# Patient Record
Sex: Male | Born: 1965 | Race: Black or African American | Hispanic: No | Marital: Married | Smoking: Never smoker
Health system: Southern US, Community
[De-identification: ages and names within clinical notes are randomized; demographics above are authoritative.]

## PROBLEM LIST (undated history)

## (undated) DIAGNOSIS — I10 Essential (primary) hypertension: Secondary | ICD-10-CM

## (undated) HISTORY — PX: BACK SURGERY: SHX140

---

## 1999-08-08 ENCOUNTER — Encounter (INDEPENDENT_AMBULATORY_CARE_PROVIDER_SITE_OTHER): Payer: Self-pay | Admitting: Specialist

## 1999-08-08 ENCOUNTER — Encounter: Payer: Self-pay | Admitting: Emergency Medicine

## 1999-08-09 ENCOUNTER — Inpatient Hospital Stay (HOSPITAL_COMMUNITY): Admission: EM | Admit: 1999-08-09 | Discharge: 1999-08-10 | Payer: Self-pay | Admitting: Emergency Medicine

## 2001-01-16 ENCOUNTER — Emergency Department (HOSPITAL_COMMUNITY): Admission: EM | Admit: 2001-01-16 | Discharge: 2001-01-17 | Payer: Self-pay | Admitting: Emergency Medicine

## 2008-04-15 ENCOUNTER — Emergency Department (HOSPITAL_COMMUNITY): Admission: EM | Admit: 2008-04-15 | Discharge: 2008-04-15 | Payer: Self-pay | Admitting: Emergency Medicine

## 2008-04-26 ENCOUNTER — Ambulatory Visit (HOSPITAL_COMMUNITY): Admission: RE | Admit: 2008-04-26 | Discharge: 2008-04-26 | Payer: Self-pay | Admitting: Neurosurgery

## 2008-04-29 ENCOUNTER — Ambulatory Visit (HOSPITAL_COMMUNITY): Admission: RE | Admit: 2008-04-29 | Discharge: 2008-04-30 | Payer: Self-pay | Admitting: Neurosurgery

## 2010-05-17 ENCOUNTER — Emergency Department (HOSPITAL_COMMUNITY): Admission: EM | Admit: 2010-05-17 | Discharge: 2010-05-17 | Payer: Self-pay | Admitting: Emergency Medicine

## 2011-05-14 NOTE — Op Note (Signed)
NAMERITA, PROM                ACCOUNT NO.:  [id number]   MEDICAL RECORD NO.:  [id number]          PATIENT TYPE:  OIB   LOCATION:  3537                         FACILITY:  [facility]   PHYSICIAN:  Danae Orleans. Venetia Maxon, M.D.  DATE OF BIRTH:  1966/09/20   DATE OF PROCEDURE:  04/29/2008  DATE OF DISCHARGE:                               OPERATIVE REPORT   PREOPERATIVE DIAGNOSES:  Herniated cervical disk with cervical  spondylosis, stenosis, and cervical myelopathy at C4-5 and C5-6 levels.   POSTOPERATIVE DIAGNOSES:  Herniated cervical disk with cervical  spondylosis, stenosis, and cervical myelopathy at C4-5 and C5-6 levels.   PROCEDURE:  Anterior cervical decompression and fusion C4-5 and C5-6  with allograft bone graft, morselized bone autograft with local  autograft FortrOss and anterior cervical plate.   SURGEON:  Danae Orleans. Venetia Maxon, MD   ASSISTANT:  Hilda Lias, MD   ANESTHESIA:  General endotracheal anesthesia.   ESTIMATED BLOOD LOSS:  Minimal.   COMPLICATIONS:  None.   DISPOSITION:  Recovery.   INDICATIONS:  Alex Barry is a 45 year old man with acute C5  radiculopathy with cord compression and significant stenosis at C4-5 and  C5-6 levels.  Preoperative MRI demonstrates a foraminal disk herniation  at C4-5 with right C5 nerve root compression as well as canal stenosis  and cord signal at the level of C5.  Cervical canal diameter is 8 mm at  C4-5 and C5-6 levels.  We elected to take him surgery on an urgent basis  for anterior cervical decompression and fusion at the C4-5 and C5-6  levels.   PROCEDURE:  Alex Barry was brought to the operating room.  Following  satisfactory and uncomplicated induction of general endotracheal  anesthesia plus intravenous lines, the patient was placed in the supine  position on the operating table.  His neck was maintained in neutral  alignment.  He was placed in 5 pounds halter traction.  His anterior  neck was then prepped and draped in the  usual sterile fashion.  The area  of planned incision was infiltrated with 0.25% Marcaine and 0.5%  lidocaine with 1:100,000 epinephrine.  Incision was made from midline to  the anterior border of sternocleidomastoid muscle and carried sharply  through platysma layer.  The subplatysmal dissection was performed  exposing the anterior border of sternocleidomastoid muscle using blunt  dissection.  The carotid sheath was kept lateral and trachea and  esophagus kept medial exposing the anterior cervical spine.  A bent  spinal needle was placed was felt to be at the C4-5 level and this was  confirmed on intraoperative x-ray.  Subsequently, the longus colli  muscles were taken down from C4-C6 using electrocautery, Key elevator,  and self-retaining shadow line retractor was placed to facilitate the  exposure along with up and down retractors.  The interspaces at C4-5 and  C5-6 were then incised with a 15 blade and disk material was removed in  the piecemeal fashion.  Caspar distraction pins were placed at C4 and  C5.  Using gentle distraction, the interspace was opened.  Endplates  were stripped of residual disk  material, and using high-speed drill, the  endplates were slightly decorticated and bone drilled at this time was  saved for later use in bone grafting.  The uncinate spurs were drilled  down bilaterally using a smaller high-speed drill bit.  The posterior  longitudinal ligament was then incised and removed in piecemeal fashion,  and decompression was carried out particularly overlying the right C5  nerve root.  A large amount of herniated disk material including a  fragment that was quite lateral in the foramen were removed with  resultant significant decompression of the C5 nerve root.  Hemostasis  was assured with Gelfoam soaked in thrombin.  Spinal cord dura was  decompressed as well as the left neural foramen.  After trial sizing a 7-  mm bone allograft wedge which was fashioned with  high-speed drill,  packed with morselized bone autograft which was mixed with FortrOss and  autogenous blood, inserted in the interspace and countersunk  appropriately.  Attention was then turned to the C5-6 level and  distraction pin was removed from C4 placed at C6, and using gentle  distraction, this interspace was also opened.  The endplates were again  decorticated and uncinate spurs drilled down.  The posterior  longitudinal ligament was incised and removed in piecemeal fashion using  2-3 mm gold-tip Kerrison rongeurs and both of these C6 nerve roots were  decompressed as they extended out the neural foramina as was the central  spinal cord dura.  Hemostasis again assured and after trial sizing a 6-  mm allograft bone wedge was selected, fashioned with high-speed drill,  packed with morselized bone autograft FortrOss and autogenous blood,  inserted in the interspace and countersunk appropriately.  A 32-mm of  Trestle anterior cervical plate was then affixed to the anterior  cervical spine using variable angle of 14-mm screws, two at C5 and two  at C6.  All screws had excellent purchase.  Locking mechanisms were  engaged.  Final x-ray demonstrated well-positioned interbody grafts and  anterior cervical plate.  The superior aspect of the construct was  visualized, although the inferior aspect was not visualized because of  the patient's large body habitus.  The traction weight was removed prior  to placing the plate.  Hemostasis was assured.  Soft tissues were  inspected and found to be in good repair.  Platysma layer was closed  with 3-0 Vicryl sutures.  Skin edges were approximated with 3-0 Vicryl  subcuticular stitch.  The wound was dressed with Dermabond.  The patient  was extubated in the operating room and taken to recovery room in stable  satisfactory condition having tolerated this operation well.  Counts  were correct at the end of the case.      Danae Orleans. Venetia Maxon, M.D.   Electronically Signed     JDS/MEDQ  D:  04/29/2008  T:  04/30/2008  Job:  [id number]

## 2012-08-19 ENCOUNTER — Emergency Department (HOSPITAL_COMMUNITY)
Admission: EM | Admit: 2012-08-19 | Discharge: 2012-08-19 | Disposition: A | Discharge status: Home / Self Care | Payer: Self-pay | Attending: Emergency Medicine | Admitting: Emergency Medicine

## 2012-08-19 ENCOUNTER — Encounter (HOSPITAL_COMMUNITY): Payer: Self-pay | Admitting: *Deleted

## 2012-08-19 DIAGNOSIS — T148XXA Other injury of unspecified body region, initial encounter: Secondary | ICD-10-CM

## 2012-08-19 DIAGNOSIS — R252 Cramp and spasm: Secondary | ICD-10-CM

## 2012-08-19 DIAGNOSIS — M79609 Pain in unspecified limb: Secondary | ICD-10-CM | POA: Insufficient documentation

## 2012-08-19 DIAGNOSIS — M7989 Other specified soft tissue disorders: Secondary | ICD-10-CM | POA: Insufficient documentation

## 2012-08-19 MED ORDER — DIAZEPAM 5 MG PO TABS
5.0000 mg | ORAL_TABLET | Freq: Once | ORAL | Status: AC
  Administered 2012-08-19: 5 mg via ORAL
  Filled 2012-08-19: qty 1

## 2012-08-19 MED ORDER — IBUPROFEN 200 MG PO TABS
600.0000 mg | ORAL_TABLET | Freq: Once | ORAL | Status: AC
  Administered 2012-08-19: 600 mg via ORAL
  Filled 2012-08-19: qty 3

## 2012-08-19 MED ORDER — CYCLOBENZAPRINE HCL 10 MG PO TABS: 10.0000 mg | ORAL_TABLET | Freq: Two times a day (BID) | ORAL | Status: AC | PRN

## 2012-08-19 MED ORDER — HYDROCODONE-ACETAMINOPHEN 5-325 MG PO TABS: 1.0000 | ORAL_TABLET | Freq: Four times a day (QID) | ORAL | Status: AC | PRN

## 2012-08-19 MED ORDER — IBUPROFEN 600 MG PO TABS: 600.0000 mg | ORAL_TABLET | Freq: Four times a day (QID) | ORAL | Status: AC | PRN

## 2012-08-19 MED ORDER — HYDROCODONE-ACETAMINOPHEN 5-325 MG PO TABS
2.0000 | ORAL_TABLET | Freq: Once | ORAL | Status: AC
  Administered 2012-08-19: 2 via ORAL
  Filled 2012-08-19: qty 2

## 2012-08-19 NOTE — ED Notes (Signed)
Pt c/o upper left thigh pain since Saturday.  States he is worried he pulled a muscle, heavy lifting at work.

## 2012-08-19 NOTE — ED Provider Notes (Signed)
History     CSN: [id number]  Arrival date & time 08/19/12  6578   First MD Initiated Contact with Patient 08/19/12 [phone]      Chief Complaint  Patient presents with  . Leg Pain    (Consider location/radiation/quality/duration/timing/severity/associated sxs/prior treatment) HPI Comments: Pt with no medical hx comes in w/ cc of right leg pain. Pt states that his pain started on Saturday, but got worse last night, and he was unable to sleep thus he came to the ED. The pain is located in the lateral aspect of the thigh, and is reproducible with him walking, palpation and putting weight on it. No trauma that patient can remember - but he did have some heavy work over the weekend, and did have some "bumps" along the way. Pt has no hx of DVT, and there is no risk factor for the same.. No leg swelling, no redness/rash. No n/v/f/c.  Patient is a 46 y.o. male presenting with leg pain. The history is provided by the patient.  Leg Pain     History reviewed. No pertinent past medical history.  Past Surgical History  Procedure Date  . Back surgery     History reviewed. No pertinent family history.  History  Substance Use Topics  . Smoking status: Never Smoker   . Smokeless tobacco: Not on file  . Alcohol Use: Yes      Review of Systems  Constitutional: Negative for activity change and appetite change.  Respiratory: Negative for cough and shortness of breath.   Cardiovascular: Negative for chest pain.  Gastrointestinal: Negative for abdominal pain.  Genitourinary: Negative for dysuria.  Musculoskeletal: Positive for myalgias. Negative for arthralgias.  Skin: Negative for rash.    Allergies  Review of patient's allergies indicates no known allergies.  Home Medications  No current outpatient prescriptions on file.  BP 178/91  Pulse 97  Temp 98.3 F (36.8 C) (Oral)  Resp 18  SpO2 95%  Physical Exam  Nursing note and vitals reviewed. Constitutional: He is oriented to  person, place, and time. He appears well-developed.  HENT:  Head: Normocephalic and atraumatic.  Eyes: Conjunctivae and EOM are normal. Pupils are equal, round, and reactive to light.  Neck: Normal range of motion. Neck supple.  Cardiovascular: Normal rate and regular rhythm.   Pulmonary/Chest: Effort normal and breath sounds normal.  Abdominal: Soft. Bowel sounds are normal. He exhibits no distension. There is no tenderness. There is no rebound and no guarding.  Musculoskeletal: He exhibits no edema.       Left thigh laterally -there is tenderness to palpation just inferior to the gluteus and going down to the mid femur. There is no rubor, calor, dolor. Mild swelling only. The calf appears normal, with no tenderness, or swelling  Neurological: He is alert and oriented to person, place, and time.  Skin: Skin is warm.    ED Course  Procedures (including critical care time)  Labs Reviewed - No data to display No results found.   No diagnosis found.    MDM  DDx: DVT Cellulitis Osteomyelitis Superficial thrombophlebitis Osteomyelitis Trauma Contusion  A/P: Pt comes in with unilateral leg pain. There is no clinical evidence of DVT, infection. Pt has no risk factors for DVT, and my pretest probability for DVT is so low, that i frankly don't even think there is need for dimer or rule out DVT. Again - the pain is worse with palpation, is localized lateral thigh and there is no clinical evidence of DVT.  Symptoms and exam more consistent with spaskm or contusion.  Derwood Kaplan, MD 08/19/12 [phone]

## 2012-08-19 NOTE — ED Notes (Signed)
Pt ambulated to discharge independently

## 2016-12-26 ENCOUNTER — Emergency Department (HOSPITAL_COMMUNITY): Payer: Managed Care, Other (non HMO)

## 2016-12-26 ENCOUNTER — Emergency Department (HOSPITAL_COMMUNITY)
Admission: EM | Admit: 2016-12-26 | Discharge: 2016-12-26 | Disposition: A | Discharge status: Home / Self Care | Payer: Managed Care, Other (non HMO) | Attending: Emergency Medicine | Admitting: Emergency Medicine

## 2016-12-26 ENCOUNTER — Encounter (HOSPITAL_COMMUNITY): Payer: Self-pay

## 2016-12-26 DIAGNOSIS — Z79899 Other long term (current) drug therapy: Secondary | ICD-10-CM | POA: Insufficient documentation

## 2016-12-26 DIAGNOSIS — M6281 Muscle weakness (generalized): Secondary | ICD-10-CM | POA: Insufficient documentation

## 2016-12-26 DIAGNOSIS — Z5181 Encounter for therapeutic drug level monitoring: Secondary | ICD-10-CM | POA: Insufficient documentation

## 2016-12-26 DIAGNOSIS — R200 Anesthesia of skin: Secondary | ICD-10-CM | POA: Insufficient documentation

## 2016-12-26 DIAGNOSIS — I10 Essential (primary) hypertension: Secondary | ICD-10-CM | POA: Diagnosis not present

## 2016-12-26 DIAGNOSIS — R42 Dizziness and giddiness: Secondary | ICD-10-CM | POA: Diagnosis present

## 2016-12-26 HISTORY — DX: Essential (primary) hypertension: I10

## 2016-12-26 LAB — COMPREHENSIVE METABOLIC PANEL
ALT: 46 U/L (ref 17–63)
AST: 51 U/L — ABNORMAL HIGH (ref 15–41)
Albumin: 4.5 g/dL (ref 3.5–5.0)
Alkaline Phosphatase: 54 U/L (ref 38–126)
Anion gap: 13 (ref 5–15)
BUN: 18 mg/dL (ref 6–20)
CO2: 28 mmol/L (ref 22–32)
Calcium: 10.4 mg/dL — ABNORMAL HIGH (ref 8.9–10.3)
Chloride: 96 mmol/L — ABNORMAL LOW (ref 101–111)
Creatinine, Ser: 1.38 mg/dL — ABNORMAL HIGH (ref 0.61–1.24)
GFR calc Af Amer: >60 mL/min (ref >60)
GFR calc non Af Amer: 59 mL/min — ABNORMAL LOW (ref >60)
Glucose, Bld: 146 mg/dL — ABNORMAL HIGH (ref 65–99)
Potassium: 4.3 mmol/L (ref 3.5–5.1)
Sodium: 137 mmol/L (ref 135–145)
Total Bilirubin: 1.7 mg/dL — ABNORMAL HIGH (ref 0.3–1.2)
Total Protein: 8.3 g/dL — ABNORMAL HIGH (ref 6.5–8.1)

## 2016-12-26 LAB — DIFFERENTIAL
Basophils Absolute: 0 10*3/uL (ref 0.0–0.1)
Basophils Relative: 0 %
Eosinophils Absolute: 0.1 10*3/uL (ref 0.0–0.7)
Eosinophils Relative: 1 %
Lymphocytes Relative: 22 %
Lymphs Abs: 2.2 10*3/uL (ref 0.7–4.0)
Monocytes Absolute: 0.6 10*3/uL (ref 0.1–1.0)
Monocytes Relative: 6 %
Neutro Abs: 7 10*3/uL (ref 1.7–7.7)
Neutrophils Relative %: 71 %

## 2016-12-26 LAB — APTT: aPTT: 34 seconds (ref 24–36)

## 2016-12-26 LAB — I-STAT CHEM 8, ED
BUN: 22 mg/dL — ABNORMAL HIGH (ref 6–20)
Calcium, Ion: 1.22 mmol/L (ref 1.15–1.40)
Chloride: 97 mmol/L — ABNORMAL LOW (ref 101–111)
Creatinine, Ser: 1.3 mg/dL — ABNORMAL HIGH (ref 0.61–1.24)
Glucose, Bld: 145 mg/dL — ABNORMAL HIGH (ref 65–99)
HCT: 54 % — ABNORMAL HIGH (ref 39.0–52.0)
Hemoglobin: 18.4 g/dL — ABNORMAL HIGH (ref 13.0–17.0)
Potassium: 4.2 mmol/L (ref 3.5–5.1)
Sodium: 139 mmol/L (ref 135–145)
TCO2: 30 mmol/L (ref 0–100)

## 2016-12-26 LAB — CBC
HCT: 51.8 % (ref 39.0–52.0)
Hemoglobin: 17.2 g/dL — ABNORMAL HIGH (ref 13.0–17.0)
MCH: 31.6 pg (ref 26.0–34.0)
MCHC: 33.2 g/dL (ref 30.0–36.0)
MCV: 95 fL (ref 78.0–100.0)
Platelets: 280 10*3/uL (ref 150–400)
RBC: 5.45 MIL/uL (ref 4.22–5.81)
RDW: 13.7 % (ref 11.5–15.5)
WBC: 9.9 10*3/uL (ref 4.0–10.5)

## 2016-12-26 LAB — I-STAT TROPONIN, ED: Troponin i, poc: 0.01 ng/mL (ref 0.00–0.08)

## 2016-12-26 LAB — PROTIME-INR
INR: 0.95
Prothrombin Time: 12.7 seconds (ref 11.4–15.2)

## 2016-12-26 MED ORDER — LABETALOL HCL 5 MG/ML IV SOLN
20.0000 mg | Freq: Once | INTRAVENOUS | Status: AC
  Administered 2016-12-26: 20 mg via INTRAVENOUS
  Filled 2016-12-26: qty 4

## 2016-12-26 MED ORDER — LORAZEPAM 2 MG/ML IJ SOLN
1.0000 mg | INTRAMUSCULAR | Status: DC | PRN
  Administered 2016-12-26: 1 mg via INTRAVENOUS
  Filled 2016-12-26: qty 1

## 2016-12-26 MED ORDER — LABETALOL HCL 100 MG PO TABS: 100.0000 mg | ORAL_TABLET | Freq: Two times a day (BID) | ORAL | 0 refills | Status: AC

## 2016-12-26 NOTE — Discharge Planning (Signed)
Pt up for discharge. [facility] reviewed chart for possible CM needs. [facility] met with pt at bedside regarding PCP establishment and encouraged pt to call number on card for PCP accepting new pts for his insurance.  Pt agrees to plan.  No needs further identified or communicated.

## 2016-12-26 NOTE — Discharge Instructions (Signed)
Exercise 20 minutes per day.  Eliminate salt.  Lofat diet. Weight control.  RX for labetalol for 30 days.

## 2016-12-26 NOTE — ED Provider Notes (Signed)
[facility]-EMERGENCY DEPT Provider Note   CSN: [id number] Arrival date & time: 12/26/16  1191     History   Chief Complaint Chief Complaint  Patient presents with  . Hypertension  . Dizziness    HPI Alex Barry is a 50 y.o. male.  He presents with dissiness and Blood Pressure concerns.    HPI:  He reports a history of hypertension. Was last on meds unknown time ago but he thinks it may has been as long as one year. This for the last several weeks she's noticed that when he stands he feels lightheaded for a few seconds. This is not a persistent dizziness. Denies vertigo. No chest pain. No leg edema no difficulty breathing. No PND orthopnea. States intermittently less hand will feel numb and it felt numb this morning when he awakened but has resolved now.  Past Medical History:  Diagnosis Date  . Hypertension     There are no active problems to display for this patient.   Past Surgical History:  Procedure Laterality Date  . BACK SURGERY         Home Medications    Prior to Admission medications   Medication Sig Start Date End Date Taking? Authorizing Provider  labetalol (NORMODYNE) 100 MG tablet Take 1 tablet (100 mg total) by mouth 2 (two) times daily. 12/26/16   Rolland Porter, MD    Family History History reviewed. No pertinent family history.  Social History Social History  Substance Use Topics  . Smoking status: Never Smoker  . Smokeless tobacco: Never Used  . Alcohol use Yes     Allergies   Patient has no known allergies.   Review of Systems Review of Systems  Constitutional: Negative for appetite change, chills, diaphoresis, fatigue and fever.  HENT: Negative for mouth sores, sore throat and trouble swallowing.   Eyes: Negative for visual disturbance.  Respiratory: Negative for cough, chest tightness, shortness of breath and wheezing.   Cardiovascular: Negative for chest pain.  Gastrointestinal: Negative for abdominal distention, abdominal pain,  diarrhea, nausea and vomiting.  Endocrine: Negative for polydipsia, polyphagia and polyuria.  Genitourinary: Negative for dysuria, frequency and hematuria.  Musculoskeletal: Negative for gait problem.  Skin: Negative for color change, pallor and rash.  Neurological: Positive for dizziness. Negative for syncope, light-headedness and headaches.  Hematological: Does not bruise/bleed easily.  Psychiatric/Behavioral: Negative for behavioral problems and confusion.     Physical Exam Updated Vital Signs BP (!) 170/108   Pulse 94   Temp 97.7 F (36.5 C) (Oral)   Resp 16   Ht 5\' 9"  (1.753 m)   Wt 210 lb (95.3 kg)   SpO2 92%   BMI 31.01 kg/m   Physical Exam  Constitutional: He is oriented to person, place, and time. He appears well-developed and well-nourished. No distress.  HENT:  Head: Normocephalic.  Eyes: Conjunctivae are normal. Pupils are equal, round, and reactive to light. No scleral icterus.  Neck: Normal range of motion. Neck supple. No thyromegaly present.  Cardiovascular: Normal rate and regular rhythm.  Exam reveals no gallop and no friction rub.   No murmur heard. Pulmonary/Chest: Effort normal and breath sounds normal. No respiratory distress. He has no wheezes. He has no rales.  Abdominal: Soft. Bowel sounds are normal. He exhibits no distension. There is no tenderness. There is no rebound.  Musculoskeletal: Normal range of motion.  Neurological: He is alert and oriented to person, place, and time.  Intact symmetric cranial nerves. No pronator drift. Reports "tingling" on  his left hand but does report normal sensation. normal sensation right upper cherry. normal strength sensation to bilateral reduction raise. normal gait.  Skin: Skin is warm and dry. No rash noted.  Psychiatric: He has a normal mood and affect. His behavior is normal.     ED Treatments / Results  Labs (all labs ordered are listed, but only abnormal results are displayed) Labs Reviewed  CBC -  Abnormal; Notable for the following:       Result Value   Hemoglobin 17.2 (*)    All other components within normal limits  COMPREHENSIVE METABOLIC PANEL - Abnormal; Notable for the following:    Chloride 96 (*)    Glucose, Bld 146 (*)    Creatinine, Ser 1.38 (*)    Calcium 10.4 (*)    Total Protein 8.3 (*)    AST 51 (*)    Total Bilirubin 1.7 (*)    GFR calc non Af Amer 59 (*)    All other components within normal limits  I-STAT CHEM 8, ED - Abnormal; Notable for the following:    Chloride 97 (*)    BUN 22 (*)    Creatinine, Ser 1.30 (*)    Glucose, Bld 145 (*)    Hemoglobin 18.4 (*)    HCT 54.0 (*)    All other components within normal limits  PROTIME-INR  APTT  DIFFERENTIAL  I-STAT TROPOININ, ED    EKG  EKG Interpretation  Date/Time:  Thursday December 26 2016 07:39:37 EST Ventricular Rate:  116 PR Interval:  190 QRS Duration: 66 QT Interval:  302 QTC Calculation: 419 R Axis:   67 Text Interpretation:  Sinus tachycardia Right atrial enlargement Anteroseptal infarct , age undetermined Abnormal ECG Confirmed by [name], [name] ([id number]) on 12/26/2016 8:36:41 AM       Radiology Ct Head Wo Contrast  Result Date: 12/26/2016 CLINICAL DATA:  Left upper extremity weakness and left hand tingling. Hypertension EXAM: CT HEAD WITHOUT CONTRAST TECHNIQUE: Contiguous axial images were obtained from the base of the skull through the vertex without intravenous contrast. COMPARISON:  None. FINDINGS: Brain: The ventricles are normal in size and configuration. There is no intracranial mass, hemorrhage, extra-axial fluid collection, or midline shift. Gray-white compartments appear normal. No acute infarct evident. Vascular: There is no hyperdense vessel. There are foci of calcification in each distal carotid siphon region. Skull: The bony calvarium appears intact. Sinuses/Orbits: There is mucosal thickening in several ethmoid air cells bilaterally. Other visualized paranasal sinuses are  clear. Orbits appear symmetric bilaterally. Other: The visualized mastoid air cells are clear. IMPRESSION: No intracranial mass, hemorrhage, or extra-axial fluid collection. Gray-white compartments are normal. There is mild distal carotid artery calcification bilaterally. There is ethmoid sinus disease bilaterally. Electronically Signed   By: Bretta BangWilliam  Woodruff III M.D.   On: 12/26/2016 08:14   Mr Brain Wo Contrast  Result Date: 12/26/2016 CLINICAL DATA:  Dizziness and left hand numbness. Acute onset. Negative CT. EXAM: MRI HEAD WITHOUT CONTRAST TECHNIQUE: Multiplanar, multiecho pulse sequences of the brain and surrounding structures were obtained without intravenous contrast. COMPARISON:  Head CT same day. FINDINGS: Brain: The brain has normal appearance without evidence of malformation, atrophy, old or acute small or large vessel infarction, hemorrhage, hydrocephalus or extra-axial collection. No pituitary abnormality. Vascular: Major vessels at the base of the brain show flow. Skull and upper cervical spine: Normal Sinuses/Orbits: Clear/ normal. Other: None significant. IMPRESSION: Normal examination. Electronically Signed   By: Paulina FusiMark  Shogry M.D.   On:  12/26/2016 10:37    Procedures Procedures (including critical care time)  Medications Ordered in ED Medications  LORazepam (ATIVAN) injection 1 mg (1 mg Intravenous Given 12/26/16 0929)  labetalol (NORMODYNE,TRANDATE) injection 20 mg (20 mg Intravenous Given 12/26/16 0900)     Initial Impression / Assessment and Plan / ED Course  I have reviewed the triage vital signs and the nursing notes.  Pertinent labs & imaging results that were available during my care of the patient were reviewed by me and considered in my medical decision making (see chart for details).  Clinical Course     She labetalol one dose IV. Recheck 152/70. CT, and MRI shows no acute abnormalities. He feels well no symptoms currently. Discharge home. Her sugars of her  labetalol 100 twice a day. Primary care follow-up. Encourage diet, salt restriction, weight control, exercise. Follow-up, blood pressure medicine complaints.  Final Clinical Impressions(s) / ED Diagnoses   Final diagnoses:  Hypertension, unspecified type    New Prescriptions New Prescriptions   LABETALOL (NORMODYNE) 100 MG TABLET    Take 1 tablet (100 mg total) by mouth 2 (two) times daily.     Rolland PorterMark Maleek Craver, MD 12/26/16 1153

## 2016-12-26 NOTE — ED Triage Notes (Signed)
Pt reports he believes his BP is high. He has dizziness and left arm numbness that began yesterday. Pt reports hx of htn and has not been taking medications.

## 2016-12-26 NOTE — ED Notes (Signed)
Pt transported to MRI 

## 2016-12-26 NOTE — ED Notes (Signed)
Pt given discharge paperwork and prescriptions. Pt verbalized understanding.  

## 2020-10-22 ENCOUNTER — Ambulatory Visit (HOSPITAL_COMMUNITY)
Admission: EM | Admit: 2020-10-22 | Discharge: 2020-10-22 | Disposition: A | Discharge status: Home / Self Care | Payer: Worker's Compensation | Attending: Family Medicine | Admitting: Family Medicine

## 2020-10-22 ENCOUNTER — Other Ambulatory Visit: Payer: Self-pay

## 2020-10-22 ENCOUNTER — Encounter (HOSPITAL_COMMUNITY): Payer: Self-pay | Admitting: *Deleted

## 2020-10-22 DIAGNOSIS — R030 Elevated blood-pressure reading, without diagnosis of hypertension: Secondary | ICD-10-CM

## 2020-10-22 DIAGNOSIS — M545 Low back pain, unspecified: Secondary | ICD-10-CM | POA: Diagnosis not present

## 2020-10-22 MED ORDER — METHYLPREDNISOLONE 4 MG PO TBPK: ORAL_TABLET | ORAL | 0 refills | Status: AC

## 2020-10-22 MED ORDER — HYDROCODONE-ACETAMINOPHEN 7.5-325 MG PO TABS: 1.0000 | ORAL_TABLET | Freq: Four times a day (QID) | ORAL | 0 refills | Status: AC | PRN

## 2020-10-22 NOTE — ED Triage Notes (Signed)
Pt reports he pick up a tank at work and heard his back pop . Pt reports pain does not radiate to legs. Pt arrived to UC with back brace from home. Pt ambulatory to room with out assistance.

## 2020-10-22 NOTE — ED Provider Notes (Signed)
[facility]    CSN: [id number] Arrival date & time: 10/22/20  1125      History   Chief Complaint Chief Complaint  Patient presents with  . Back Pain    HPI Alex Barry is a 54 y.o. male.   HPI  Patient states that he has to manage heavy things at work.  He states that he has to lift and push and pull on these during the day.  He occasionally has low back pain.  He wears a back brace when he remembers.  He has never had a significant back injury or known back condition.  He states that he was moving everything when he felt a pop and pain in his back.  Is in the central low back.  No radiation.  No numbness or weakness.  No bowel or bladder complaint  Past Medical History:  Diagnosis Date  . Hypertension     There are no problems to display for this patient.   Past Surgical History:  Procedure Laterality Date  . BACK SURGERY         Home Medications    Prior to Admission medications   Medication Sig Start Date End Date Taking? Authorizing Provider  HYDROcodone-acetaminophen (NORCO) 7.5-325 MG tablet Take 1 tablet by mouth every 6 (six) hours as needed for moderate pain. 10/22/20   Eustace Moore, MD  labetalol (NORMODYNE) 100 MG tablet Take 1 tablet (100 mg total) by mouth 2 (two) times daily. 12/26/16   Rolland Porter, MD  methylPREDNISolone (MEDROL DOSEPAK) 4 MG TBPK tablet TAD 10/22/20   Eustace Moore, MD    Family History History reviewed. No pertinent family history.  Social History Social History   Tobacco Use  . Smoking status: Never Smoker  . Smokeless tobacco: Never Used  Substance Use Topics  . Alcohol use: Yes  . Drug use: No     Allergies   Patient has no known allergies.   Review of Systems Review of Systems  See HPI Physical Exam Triage Vital Signs ED Triage Vitals  Enc Vitals Group     BP 10/22/20 1222 (!) 189/92     Pulse Rate 10/22/20 1222 95     Resp 10/22/20 1222 18     Temp 10/22/20 1222 98.8 F (37.1  C)     Temp Source 10/22/20 1222 Oral     SpO2 10/22/20 1222 96 %     Weight 10/22/20 1223 196 lb (88.9 kg)     Height 10/22/20 1223 5\' 9"  (1.753 m)     Head Circumference --      Peak Flow --      Pain Score 10/22/20 1223 10     Pain Loc --      Pain Edu? --      Excl. in GC? --    No data found.  Updated Vital Signs BP (!) 189/92 (BP Location: Right Arm)   Pulse 95   Temp 98.8 F (37.1 C) (Oral)   Resp 18   Ht 5\' 9"  (1.753 m)   Wt 88.9 kg   SpO2 96%   BMI 28.94 kg/m     Physical Exam Constitutional:      General: He is not in acute distress.    Appearance: He is well-developed and normal weight.  HENT:     Head: Normocephalic and atraumatic.     Mouth/Throat:     Comments: Mask is in place Eyes:     Conjunctiva/sclera: Conjunctivae  normal.     Pupils: Pupils are equal, round, and reactive to light.  Cardiovascular:     Rate and Rhythm: Normal rate.  Pulmonary:     Effort: Pulmonary effort is normal. No respiratory distress.  Abdominal:     General: There is no distension.     Palpations: Abdomen is soft.  Musculoskeletal:        General: Normal range of motion.     Cervical back: Normal range of motion.     Comments: Back is straight and symmetric.  There is no tenderness to palpation.  No palpable muscle spasm.  Patient can flex fingertips to just below the knees and, arise slowly.  Normal lateral movement.  Strength sensation range of motion and reflexes are normal in both lower extremities  Skin:    General: Skin is warm and dry.  Neurological:     General: No focal deficit present.     Mental Status: He is alert.     Motor: No weakness.     Coordination: Coordination normal.     Gait: Gait normal.     Deep Tendon Reflexes: Reflexes normal.  Psychiatric:        Mood and Affect: Mood normal.        Behavior: Behavior normal.      UC Treatments / Results  Labs (all labs ordered are listed, but only abnormal results are displayed) Labs Reviewed -  No data to display  EKG   Radiology No results found.  Procedures Procedures (including critical care time)  Medications Ordered in UC Medications - No data to display  Initial Impression / Assessment and Plan / UC Course  I have reviewed the triage vital signs and the nursing notes.  Pertinent labs & imaging results that were available during my care of the patient were reviewed by me and considered in my medical decision making (see chart for details).     Mechanical back pain.  Discussed. Recommend follow-up for blood pressure Final Clinical Impressions(s) / UC Diagnoses   Final diagnoses:  Acute midline low back pain without sciatica  Elevated blood pressure reading     Discharge Instructions     Take the Medrol Dosepak as directed.  This is a steroid anti-inflammatory.  Take all of day 1 today. Take pain medication as needed.  Do not drive on pain medication Follow-up with Worker's Comp. Tomorrow Avoid bending , lifting for a few days  You need to follow-up on your elevated blood pressure.   ED Prescriptions    Medication Sig Dispense Auth. Provider   methylPREDNISolone (MEDROL DOSEPAK) 4 MG TBPK tablet TAD 21 tablet Eustace Moore, MD   HYDROcodone-acetaminophen (NORCO) 7.5-325 MG tablet Take 1 tablet by mouth every 6 (six) hours as needed for moderate pain. 15 tablet Eustace Moore, MD     I have reviewed the PDMP during this encounter.   Eustace Moore, MD 10/22/20 [id number]

## 2020-10-22 NOTE — Discharge Instructions (Addendum)
Take the Medrol Dosepak as directed.  This is a steroid anti-inflammatory.  Take all of day 1 today. Take pain medication as needed.  Do not drive on pain medication Follow-up with Worker's Comp. Tomorrow Avoid bending , lifting for a few days  You need to follow-up on your elevated blood pressure.

## 2020-10-24 ENCOUNTER — Other Ambulatory Visit: Payer: Self-pay | Admitting: Family Medicine

## 2020-10-24 ENCOUNTER — Ambulatory Visit: Payer: Self-pay

## 2020-10-24 ENCOUNTER — Other Ambulatory Visit: Payer: Self-pay

## 2020-10-24 ENCOUNTER — Ambulatory Visit (HOSPITAL_COMMUNITY)
Admission: EM | Admit: 2020-10-24 | Discharge: 2020-10-24 | Discharge status: Against Medical Advice | Payer: Worker's Compensation

## 2020-10-24 DIAGNOSIS — M545 Low back pain, unspecified: Secondary | ICD-10-CM

## 2020-12-29 IMAGING — DX DG LUMBAR SPINE COMPLETE 4+V
5 series · 5 of 5 positions shown · non-contrast
Comparison: 05/17/2010

CLINICAL DATA: Persistent low back pain after a pulling motion on
10/19/2020

EXAM:
LUMBAR SPINE - COMPLETE 4+ VIEW

[l-spine ap]
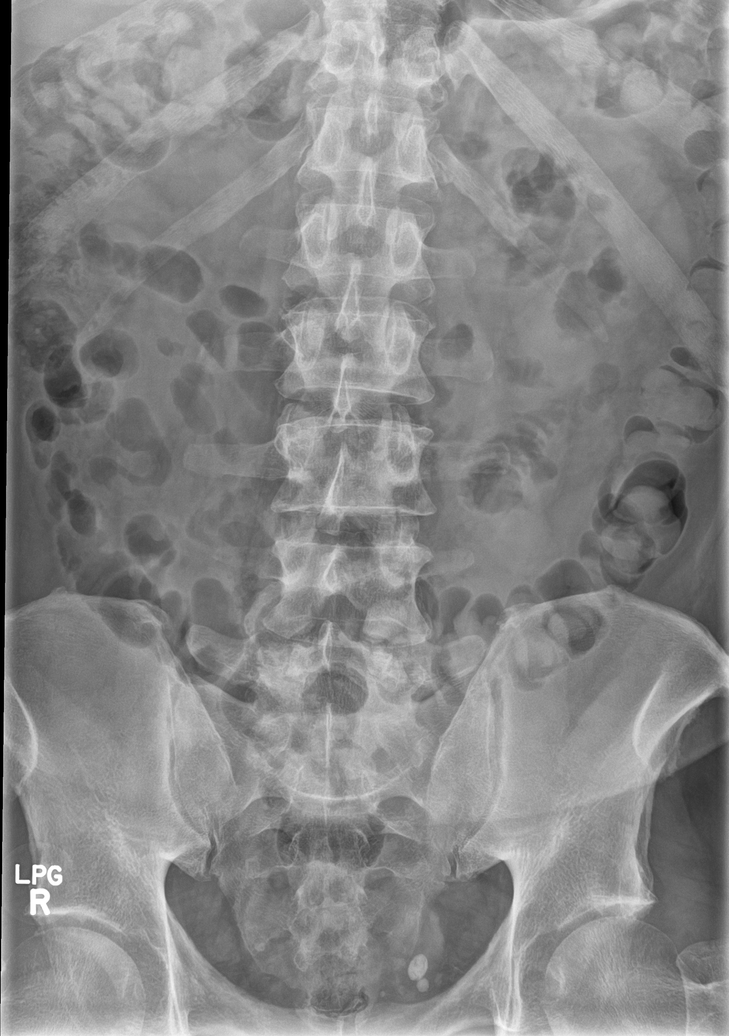

[l-spine obl (1 of 2)]
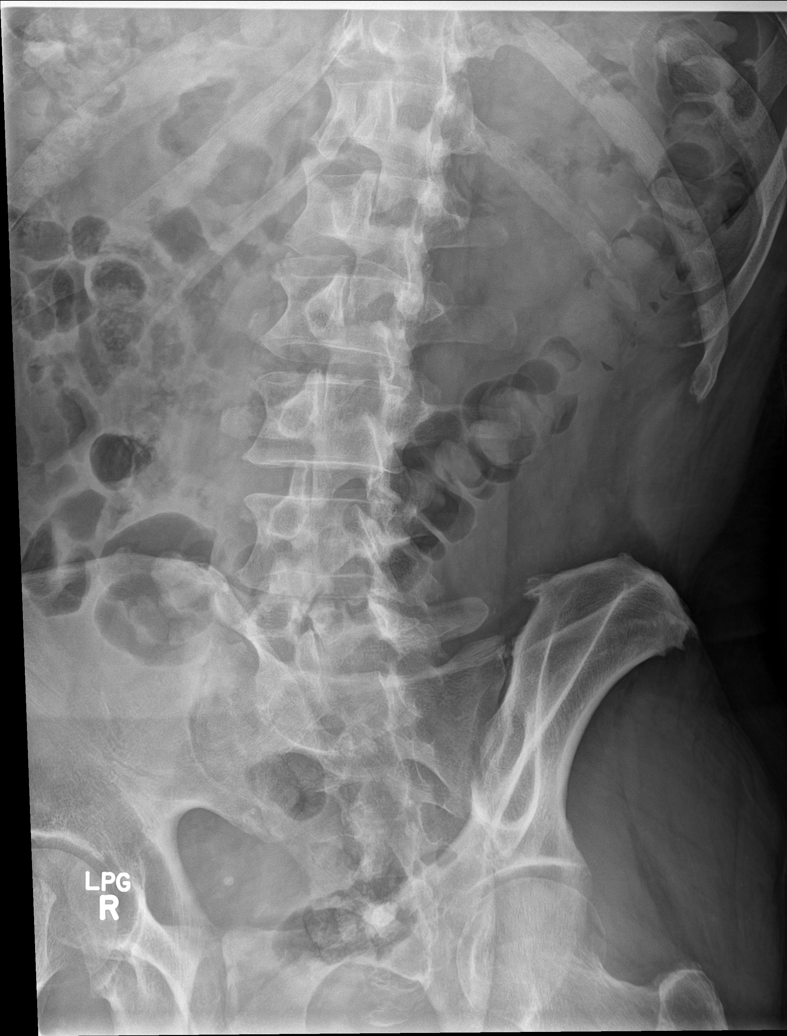

[l-spine obl (2 of 2)]
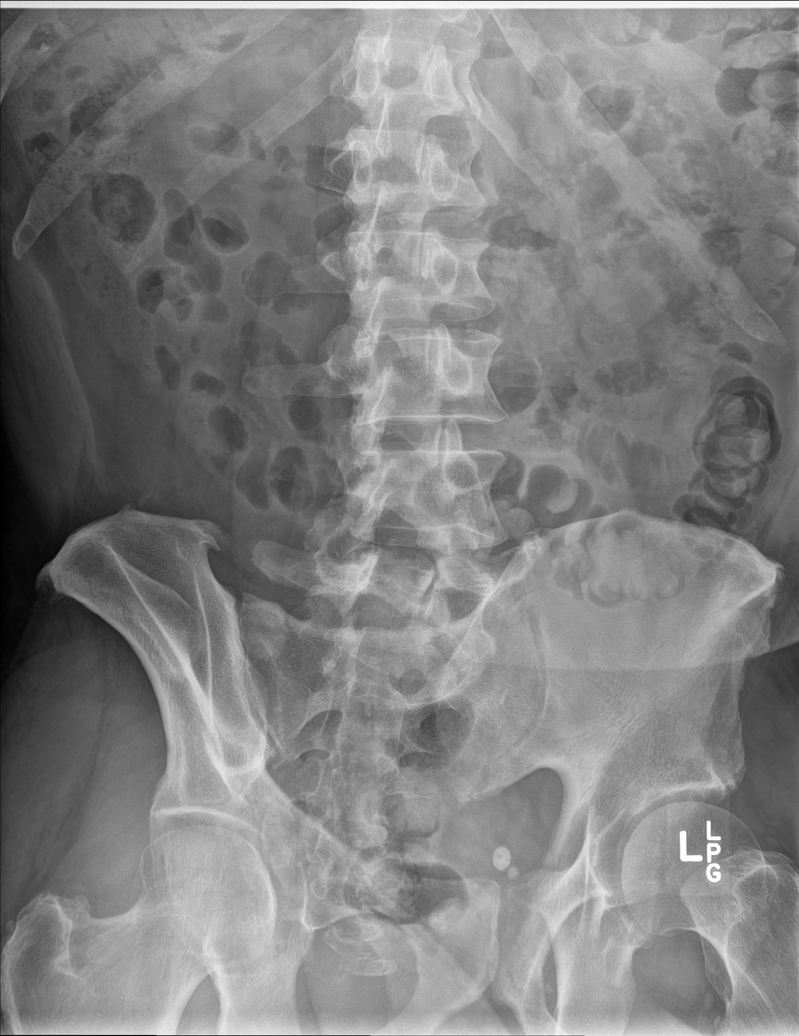

[l-spine lat]
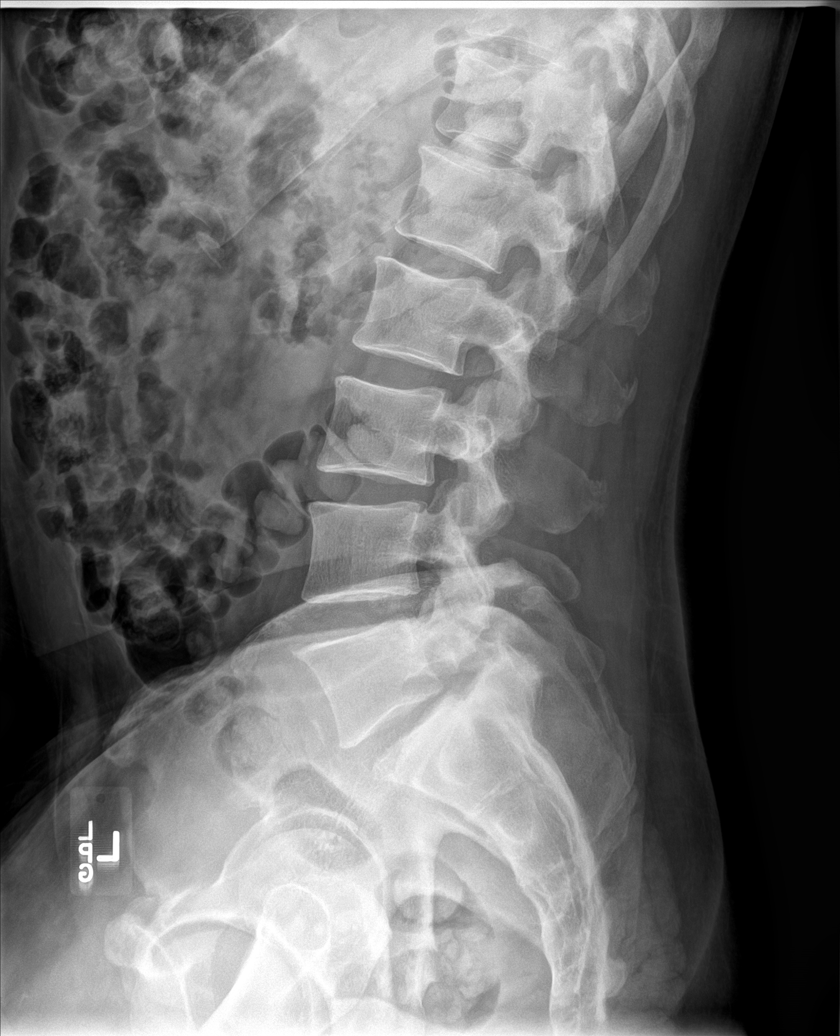

[l-spine spot]
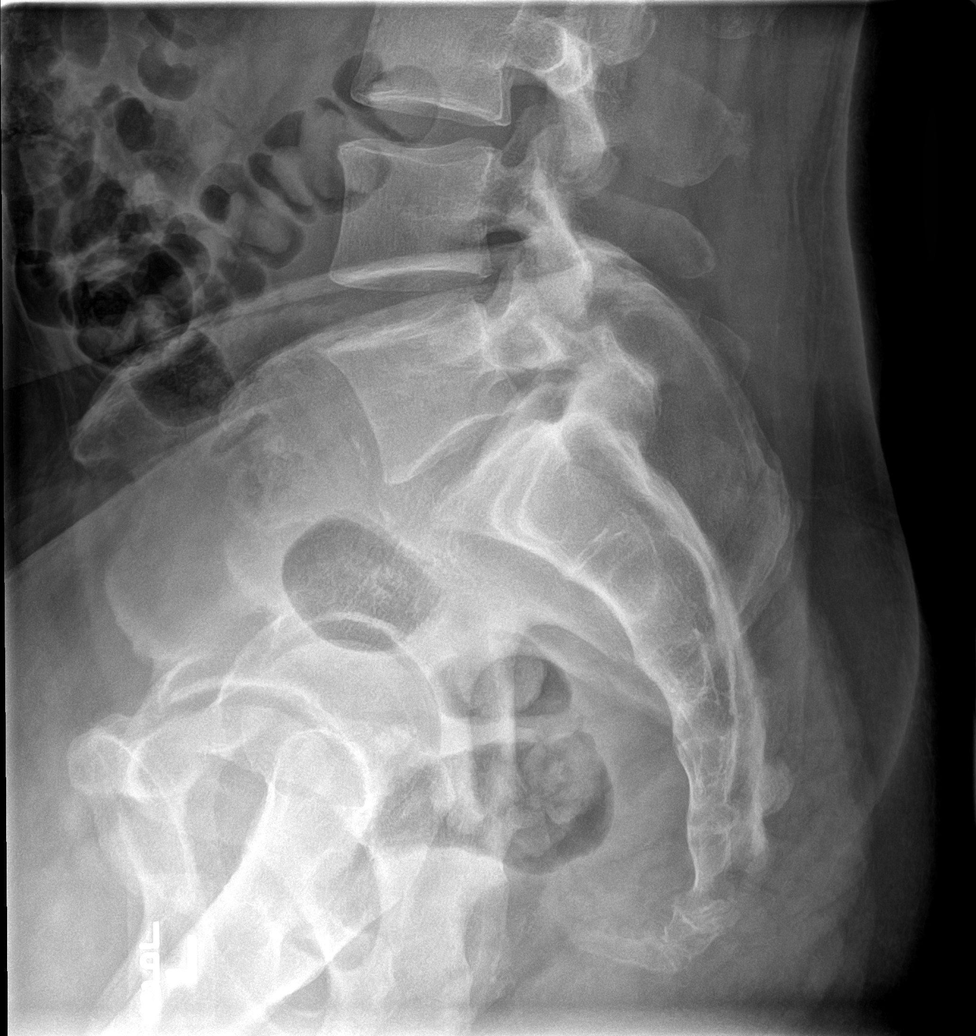

[5 of 5 positions shown; findings below may reference images not displayed]

FINDINGS: 5 non-rib-bearing lumbar vertebra.

Vertebral body and disc space heights maintained.

RIGHT spondylolysis L5 present, probably at LEFT L5 as well.

No fracture, subluxation, or bone destruction.

SI joints preserved.
IMPRESSION: Probable BILATERAL spondylolysis L5 without spondylolisthesis.
